# Patient Record
Sex: Female | Born: 1975 | Race: White | Hispanic: No | Marital: Married | Smoking: Never smoker
Health system: Southern US, Community
[De-identification: ages and names within clinical notes are randomized; demographics above are authoritative.]

## PROBLEM LIST (undated history)

## (undated) DIAGNOSIS — I10 Essential (primary) hypertension: Secondary | ICD-10-CM

## (undated) DIAGNOSIS — D649 Anemia, unspecified: Secondary | ICD-10-CM

## (undated) DIAGNOSIS — F4310 Post-traumatic stress disorder, unspecified: Secondary | ICD-10-CM

## (undated) DIAGNOSIS — F319 Bipolar disorder, unspecified: Secondary | ICD-10-CM

## (undated) HISTORY — DX: Bipolar disorder, unspecified: F31.9

## (undated) HISTORY — DX: Post-traumatic stress disorder, unspecified: F43.10

## (undated) HISTORY — DX: Essential (primary) hypertension: I10

## (undated) HISTORY — DX: Anemia, unspecified: D64.9

---

## 2006-11-02 ENCOUNTER — Emergency Department: Payer: Self-pay | Admitting: General Practice

## 2008-01-27 ENCOUNTER — Other Ambulatory Visit: Payer: Self-pay

## 2008-01-27 ENCOUNTER — Emergency Department: Payer: Self-pay | Admitting: Emergency Medicine

## 2009-12-07 ENCOUNTER — Ambulatory Visit: Payer: Self-pay | Admitting: Obstetrics and Gynecology

## 2016-02-13 ENCOUNTER — Ambulatory Visit: Payer: Self-pay

## 2016-12-15 ENCOUNTER — Telehealth: Payer: Self-pay | Admitting: Pharmacist

## 2016-12-15 NOTE — Telephone Encounter (Signed)
Called [facility] for refill on Lamictal.

## 2017-01-02 ENCOUNTER — Telehealth: Payer: Self-pay | Admitting: Pharmacist

## 2017-01-02 NOTE — Telephone Encounter (Signed)
Faxed Re Enrollment application to [facility] for Saphris.

## 2017-01-29 ENCOUNTER — Telehealth: Payer: Self-pay | Admitting: Pharmacist

## 2017-01-29 NOTE — Telephone Encounter (Signed)
01/29/2017 Lamictal 200mg  Take 1 tablet by mouth two times a day, Faxed [facility] Re Enrollment application for processing.

## 2017-02-18 ENCOUNTER — Telehealth: Payer: Self-pay | Admitting: Pharmacist

## 2017-02-18 NOTE — Telephone Encounter (Signed)
02/18/17 Patient is eligible at [facility] till 01/20/17.

## 2017-03-09 ENCOUNTER — Telehealth: Payer: Self-pay | Admitting: Pharmacist

## 2017-03-09 NOTE — Telephone Encounter (Signed)
03/09/17 Called [facility] for refill on Saphris 5mg , will ship 03/25/17.

## 2017-06-10 ENCOUNTER — Telehealth: Payer: Self-pay | Admitting: Pharmacist

## 2017-06-10 NOTE — Telephone Encounter (Signed)
06/10/17 Called [facility] for refill on Saphris 5mg .Forde RadonAJ

## 2017-09-14 ENCOUNTER — Telehealth: Payer: Self-pay | Admitting: Pharmacist

## 2017-09-14 NOTE — Telephone Encounter (Signed)
09/14/17 Called [facility] for refill on Saphris 5mg , order# [id number], allow 7-10 business days to receive.Forde RadonAJ

## 2017-12-07 ENCOUNTER — Telehealth: Payer: Self-pay | Admitting: Pharmacist

## 2017-12-07 NOTE — Telephone Encounter (Signed)
12/07/17 Patient in office today to print spouse check stubs and taxes. She also brought in [facility] application that had been mailed to her for Renewal-Lamictal. Patient has already signed her page, I have printed script and will take to Dr. Rogers BlockerAhluwalia to sign.Forde RadonAJ

## 2017-12-07 NOTE — Telephone Encounter (Signed)
12/07/17 I have received signed [facility] (Saphris) application back from patient, she has also returned 1 month of income. I am holding for provider to return their portion.Forde RadonAJ

## 2017-12-18 ENCOUNTER — Telehealth: Payer: Self-pay | Admitting: Pharmacist

## 2017-12-18 NOTE — Telephone Encounter (Signed)
12/18/17 Saphris 5mg  [facility]17 for Dr. Onnie GrahamAhulwalia to sign for this med, [facility]17 & 12/17/17 and the forms have not been signed, saw Toniann FailWendy and she will get him to sign, and call us.Forde RadonAJ

## 2017-12-18 NOTE — Telephone Encounter (Signed)
12/18/17 Lamictal 200mg  [facility]17 for Dr. Onnie GrahamAhulwalia to sign for this med, [facility]/17/17 and the forms have not been signed, saw Toniann FailWendy and she will get him to sign, and call us.Forde RadonAJ

## 2018-01-08 ENCOUNTER — Telehealth: Payer: Self-pay | Admitting: Pharmacist

## 2018-01-08 NOTE — Telephone Encounter (Signed)
01/08/2018 11:59:08 AM - Lamictal form from provider  01/08/18 [facility] pick up Lamictal forms, and this had been signed by provider, it was dropped off at their office 12/03/17.AJ   01/08/2018 12:01:46 PM - Saphris form at provider office  01/08/18 Went to provider office 01/07/18 to pick up form for Saphris that was dropped off 12/03/17, this form was not signed, they will get him to sign before I pick up again next week.Forde RadonAJ

## 2018-01-11 ENCOUNTER — Telehealth: Payer: Self-pay | Admitting: Pharmacist

## 2018-01-11 NOTE — Telephone Encounter (Signed)
01/11/2018 9:24:46 AM - Lamictal 200mg  renewal to [facility]  01/11/18 Faxed [facility] application for Renewal on Lamictal 200 mg Take 1 tablet by mouth two times a day.Forde RadonAJ

## 2018-01-15 ENCOUNTER — Telehealth: Payer: Self-pay | Admitting: Pharmacist

## 2018-01-15 NOTE — Telephone Encounter (Signed)
01/15/2018 10:46:47 AM - Saphris 5mg  Black Cherry flavor  01/15/18 Faxed PAP renewal application to [facility] for Saphris 5mg  [employer].Forde RadonAJ

## 2018-02-01 ENCOUNTER — Telehealth: Payer: Self-pay | Admitting: Pharmacy Technician

## 2018-02-01 NOTE — Telephone Encounter (Signed)
Received updated proof of income.  Patient eligible to receive medication assistance at [facility] through 2019, as long as eligibility requirements continue to be met.  [facility] [facility]

## 2019-01-24 ENCOUNTER — Telehealth: Payer: Self-pay | Admitting: Pharmacist

## 2019-01-24 NOTE — Telephone Encounter (Signed)
01/24/2019 2:46:57 PM - Lamictal refill  01/24/2019 Placed refill online with [facility] for Lamictal 200mg , order# [id number].Forde Radon

## 2019-08-01 ENCOUNTER — Telehealth: Payer: Self-pay | Admitting: Pharmacy Technician

## 2019-08-01 NOTE — Telephone Encounter (Signed)
Received 2020 proof of income.  Patient eligible to receive medication assistance at [facility] as long as eligibility requirements continue to be met.  [place] [facility]

## 2019-08-24 ENCOUNTER — Telehealth: Payer: Self-pay | Admitting: Pharmacist

## 2019-08-24 NOTE — Telephone Encounter (Signed)
08/24/2019 1:51:53 PM - Lamictal refill online with [facility]  08/24/2019 Placed refill online with [facility] for Lamictal, to ship 09/06/2019, Order# [id number].Delos Haring

## 2020-01-31 ENCOUNTER — Telehealth: Payer: Self-pay | Admitting: Pharmacy Technician

## 2020-01-31 NOTE — Telephone Encounter (Signed)
Received updated proof of income.  Patient eligible to receive medication assistance at [facility] until time for re-certification in 9359, and as long as eligibility requirements continue to be met.  [place] [facility]

## 2020-03-05 ENCOUNTER — Other Ambulatory Visit: Payer: Self-pay

## 2020-03-20 ENCOUNTER — Ambulatory Visit: Payer: Self-pay | Admitting: Pharmacist

## 2020-03-20 DIAGNOSIS — D619 Aplastic anemia, unspecified: Secondary | ICD-10-CM | POA: Insufficient documentation

## 2020-03-20 DIAGNOSIS — F4310 Post-traumatic stress disorder, unspecified: Secondary | ICD-10-CM | POA: Insufficient documentation

## 2020-03-20 DIAGNOSIS — F319 Bipolar disorder, unspecified: Secondary | ICD-10-CM | POA: Insufficient documentation

## 2020-03-20 DIAGNOSIS — Z79899 Other long term (current) drug therapy: Secondary | ICD-10-CM

## 2020-03-20 DIAGNOSIS — I10 Essential (primary) hypertension: Secondary | ICD-10-CM | POA: Insufficient documentation

## 2020-03-20 NOTE — Progress Notes (Signed)
  [facility] Visit Note  Patient: Amber Curtis MRN: [id number] Date of Birth: [birth year] PCP: No primary care provider on file.   Amber Curtis 44 y.o. female presents for a MTM visit today via telephone. Patient was identified by name and DOB.  There were no vitals taken for this visit.  Patient Information   Past Medical History:  Diagnosis Date  . Anemia   . Bipolar 1 disorder (HCC)   . Hypertension   . PTSD (post-traumatic stress disorder)      History reviewed. No pertinent surgical history.   Family History  Problem Relation Age of Onset  . Congestive Heart Failure Mother   . Hypertension Mother   . Diabetes Father   . Hypertension Sister     New Diagnoses (since last visit):   Lifestyle  does zumba everyday except the weekends Diet: Breakfast:oatmeal fruit Lunch: light lunch, grilled chicken sandwich, [place] sandwich or salad Dinner: heaviest meal of the day, fast food, cooks at home Drinks: 4 water bottles a day an occasional soda  Health Maintenance/Date Completed  Last Visit to PCP: 1 year ago Next Visit to PCP: May 30 2020 Specialist Visit: May 30 2020 Flu Vaccine: No Pneumonia Vaccine: no COVID-19 Vaccine: no Shingrix Vaccine: no  Outpatient Encounter Medications as of 03/20/2020  Medication Sig  . ARIPiprazole (ABILIFY) 15 MG tablet Take 15 mg by mouth at bedtime.  . busPIRone (BUSPAR) 10 MG tablet Take 10 mg by mouth 3 (three) times daily.  . hydrochlorothiazide (HYDRODIURIL) 25 MG tablet Take 25 mg by mouth daily.  Marland Kitchen lamoTRIgine (LAMICTAL) 200 MG tablet Take 200 mg by mouth 2 (two) times daily.  Marland Kitchen lisinopril (ZESTRIL) 20 MG tablet Take 20 mg by mouth daily.  . Multiple Vitamin (MULTIVITAMIN) capsule Take 1 capsule by mouth daily.   No facility-administered encounter medications on file as of 03/20/2020.             Social History   Substance and Sexual Activity  Alcohol Use Not Currently      Social History    Tobacco Use  Smoking Status Never Smoker  Smokeless Tobacco Never Used      Health Maintenance  Topic Date Due  . COVID-19 Vaccine (1) Never done  . HIV Screening  Never done  . TETANUS/TDAP  Never done  . PAP SMEAR-Modifier  Never done  . INFLUENZA VACCINE  06/03/2020     Assessment and Plan:  1. Bipolar/PTSD: Pt states she likes her current regimen and it's been working for her really well. She says working from home has also been good for her. No complaints of side effects.   2. HTN: Pt is on Lisinopril 20 mg and HCTZ 25 mg no complaints, stated that her last in office measurement was 137/80 and doesn't regularly check her BP. Recommended keeping a log and getting BP monitored outside of office to avoid white coat HTN.   3. Adherence/Compliance: Pt stated she has no problems remembering to take her meds and never really misses a dose. Pt also stated that she was previously on metformin however when inquired about why she d/c she said she would prefer not to divulge into that information. Also when asked whether or not she was diabetic or had a1c readings she deferred.   RTC 9 months  Kennedy Bucker (Ty) Fredericka Bottcher PharmD Candidate [facility]  Cosigned: Keri K. Joelene Millin, PharmD [facility] Clinic-Pharmacy Operations Coordinator [phone]

## 2020-03-21 ENCOUNTER — Other Ambulatory Visit: Payer: Self-pay

## 2020-03-28 ENCOUNTER — Other Ambulatory Visit: Payer: Self-pay

## 2020-06-06 ENCOUNTER — Other Ambulatory Visit: Payer: Self-pay

## 2020-07-19 ENCOUNTER — Telehealth: Payer: Self-pay | Admitting: Pharmacist

## 2020-07-19 NOTE — Telephone Encounter (Signed)
07/19/2020 4:13:39 PM - Lamictal refill online with [facility]  -- Rhetta Mura - Thursday, July 19, 2020 4:12 PM --Placed refill online with [facility] for Lamictal 200mg  , to ship 08/24/2020, Order# 08/26/2020.

## 2020-08-14 ENCOUNTER — Other Ambulatory Visit: Payer: Self-pay

## 2020-11-22 ENCOUNTER — Telehealth: Payer: Self-pay | Admitting: Pharmacy Technician

## 2020-11-22 NOTE — Telephone Encounter (Signed)
Patient called to inform [facility] that she now has prescription drug coverage with [employer].  Patient verbally acknowledged that she understood that she does not meet [facility]'s eligibility criteria.    Sherilyn Dacosta Care Manager [facility]   [address], [place]  [address]  November 22, 2020    Denessa Cavan [address] [place], [place]  [address]  Dear Marijean Niemann:  This is to inform you that you are no longer eligible to receive medication assistance at [facility].  The reason(s) are:    _____Your total gross monthly household income exceeds 250% of the Federal Poverty Level.   _____Tangible assets (savings, checking, stocks/bonds, pension, retirement, etc.) exceeds our limit  _____You are eligible to receive benefits from [facility], [facility] or HIV Medication            Assistance program _____You are eligible to receive benefits from a Medicare Part "D" plan __X__You have prescription insurance  _____You are not an [facility] resident _____Failure to provide all requested proof of income information for 2021.    We regret that we are unable to help you at this time.  If your prescription coverage is terminated, please contact [facility], so that we may reassess your eligibility for our program.  If you have questions, we may be contacted at [phone].  Thank you,  [facility]

## 2021-12-26 ENCOUNTER — Encounter: Payer: Self-pay | Admitting: Emergency Medicine

## 2021-12-26 ENCOUNTER — Other Ambulatory Visit: Payer: Self-pay

## 2021-12-26 ENCOUNTER — Ambulatory Visit
Admission: EM | Admit: 2021-12-26 | Discharge: 2021-12-26 | Disposition: A | Discharge status: Home / Self Care | Payer: BC Managed Care – PPO | Attending: Emergency Medicine | Admitting: Emergency Medicine

## 2021-12-26 ENCOUNTER — Ambulatory Visit (INDEPENDENT_AMBULATORY_CARE_PROVIDER_SITE_OTHER): Payer: BC Managed Care – PPO

## 2021-12-26 DIAGNOSIS — W19XXXA Unspecified fall, initial encounter: Secondary | ICD-10-CM

## 2021-12-26 DIAGNOSIS — M25532 Pain in left wrist: Secondary | ICD-10-CM

## 2021-12-26 DIAGNOSIS — S52122A Displaced fracture of head of left radius, initial encounter for closed fracture: Secondary | ICD-10-CM | POA: Diagnosis not present

## 2021-12-26 DIAGNOSIS — M79632 Pain in left forearm: Secondary | ICD-10-CM | POA: Diagnosis not present

## 2021-12-26 MED ORDER — OXYCODONE HCL 5 MG PO TABS: 5.0000 mg | ORAL_TABLET | ORAL | 0 refills | Status: AC | PRN

## 2021-12-26 NOTE — Discharge Instructions (Addendum)
Your x-ray today showed a fracture ( break in bone) of note in the head of her left radius bone  X-ray of your left wrist is negative for injury  A splint has been applied to your to your left arm. This is used to protect your injury and prevent further damage. Leave in place until seen by orthopedic specialist. Do not put objects into splint to scratch skin. If numbness or tingling occurs after placement please return to Urgent Care for evaluation.   May use oxycodone every 4 hours as needed for severe pain  Follow up with orthopedic specialist in 1-2 weeks. Call practice to make appointment. Information listed below. You may go to any orthopedic provider you deem fit.  Please do not put weight on fracture.

## 2021-12-26 NOTE — ED Provider Notes (Signed)
[facility]    CSN: [id number] Arrival date & time: 12/26/21  0803      History   Chief Complaint Chief Complaint  Patient presents with   Fall   Arm Injury    left    HPI Amber Curtis is a 46 y.o. female.   Patient presents with left wrist pain and left forearm pain for 1 week after falling onto concrete.  Endorses she was walking when she tripped and fell landing onto the palm of her right and left hand and her left forearm.  Symptoms have improved but are still present.  Range of motion intact but elicits pain.  Denies numbness, tingling, prior injury or trauma.  Past Medical History:  Diagnosis Date   Anemia    Bipolar 1 disorder (HCC)    Hypertension    PTSD (post-traumatic stress disorder)     Patient Active Problem List   Diagnosis Date Noted   HTN (hypertension) 03/20/2020   Bipolar 1 disorder (HCC) 03/20/2020   PTSD (post-traumatic stress disorder) 03/20/2020   Aplastic anemia (HCC) 03/20/2020    History reviewed. No pertinent surgical history.  OB History   No obstetric history on file.      Home Medications    Prior to Admission medications   Medication Sig Start Date End Date Taking? Authorizing Provider  ARIPiprazole (ABILIFY) 15 MG tablet Take 15 mg by mouth at bedtime.   Yes [provider]  busPIRone (BUSPAR) 10 MG tablet Take 10 mg by mouth 3 (three) times daily.   Yes [provider]  lamoTRIgine (LAMICTAL) 200 MG tablet Take 200 mg by mouth 2 (two) times daily.   Yes [provider]  ARIPiprazole (ABILIFY) 15 MG tablet TAKE ONE TABLET BY MOUTH EVERY DAY 06/06/20 03/02/21    hydrochlorothiazide (HYDRODIURIL) 25 MG tablet Take 25 mg by mouth daily.    [provider]  lisinopril (ZESTRIL) 20 MG tablet Take 20 mg by mouth daily.    [provider]  Multiple Vitamin (MULTIVITAMIN) capsule Take 1 capsule by mouth daily.    [provider]    Family History Family History   Problem Relation Age of Onset   Congestive Heart Failure Mother    Hypertension Mother    Diabetes Father    Hypertension Sister     Social History Social History   Tobacco Use   Smoking status: Never   Smokeless tobacco: Never  Vaping Use   Vaping Use: Never used  Substance Use Topics   Alcohol use: Not Currently   Drug use: Not Currently     Allergies   Sulfa antibiotics, Celexa [citalopram], Codeine, Depakote [divalproex sodium], Haldol [haloperidol], Penicillins, and Risperidone   Review of Systems Review of Systems Defer to HPI   Physical Exam Triage Vital Signs ED Triage Vitals  Enc Vitals Group     BP 12/26/21 0819 (!) 180/102     Pulse Rate 12/26/21 0819 75     Resp 12/26/21 0819 18     Temp 12/26/21 0819 98.5 F (36.9 C)     Temp Source 12/26/21 0819 Oral     SpO2 12/26/21 0819 98 %     Weight 12/26/21 0816 298 lb (135.2 kg)     Height 12/26/21 0816 5\' 7"  (1.702 m)     Head Circumference --      Peak Flow --      Pain Score 12/26/21 0816 8     Pain Loc --  Pain Edu? --      Excl. in [place]? --    No data found.  Updated Vital Signs BP (!) 180/102 (BP Location: Left Arm)    Pulse 75    Temp 98.5 F (36.9 C) (Oral)    Resp 18    Ht 5\' 7"  (1.702 m)    Wt 298 lb (135.2 kg)    LMP 12/23/2021 (Approximate)    SpO2 98%    BMI 46.67 kg/m   Visual Acuity Right Eye Distance:   Left Eye Distance:   Bilateral Distance:    Right Eye Near:   Left Eye Near:    Bilateral Near:     Physical Exam Constitutional:      Appearance: Normal appearance.  HENT:     Head: Normocephalic.  Eyes:     Extraocular Movements: Extraocular movements intact.  Pulmonary:     Effort: Pulmonary effort is normal.  Musculoskeletal:     Comments: Tenderness with mild swelling around the lateral aspect left forearm, no ecchymosis noted, limited range of motion, sensation intact  No tenderness or swelling noted throughout the left wrist, range of motion intact, 2+ radial  pulse,   Skin:    General: Skin is warm and dry.  Neurological:     Mental Status: She is alert and oriented to person, place, and time. Mental status is at baseline.  Psychiatric:        Mood and Affect: Mood normal.        Behavior: Behavior normal.     UC Treatments / Results  Labs (all labs ordered are listed, but only abnormal results are displayed) Labs Reviewed - No data to display  EKG   Radiology No results found.  Procedures Procedures (including critical care time)  Medications Ordered in UC Medications - No data to display  Initial Impression / Assessment and Plan / UC Course  I have reviewed the triage vital signs and the nursing notes.  Pertinent labs & imaging results that were available during my care of the patient were reviewed by me and considered in my medical decision making (see chart for details).  Closed displaced fracture of the head of the left radius, initial encounter  Fracture confirmed by x-ray, wrist x-ray negative, discussed findings with patient, sugar-tong placed to the left arm, nonweightbearing precautions, given referral to orthopedic to follow-up in 1 to 2 weeks for further management and evaluation, oxycodone IR 5 mg every 4 hours prescribed for pain, PDMP reviewed, low risk, work note given  Final Clinical Impressions(s) / UC Diagnoses   Final diagnoses:  None   Discharge Instructions   None    ED Prescriptions   None    PDMP not reviewed this encounter.   Hans Eden, [place] 12/26/21 [phone]

## 2021-12-26 NOTE — ED Triage Notes (Signed)
Pt c/o left forearm, elbow and wrist pain. She states she fell on the concrete about a week ago. She states she has been taking ibuprofen without relief. She has decreased ROM in her elbow and wrist.

## 2023-03-06 IMAGING — CR DG WRIST COMPLETE 3+V*L*
4 series · 4 of 4 positions shown · non-contrast
Comparison: None

CLINICAL DATA: LEFT forearm, elbow and wrist pain post fall on
concrete 1 week ago

EXAM:
LEFT WRIST - COMPLETE 3+ VIEW

[wrist pa]
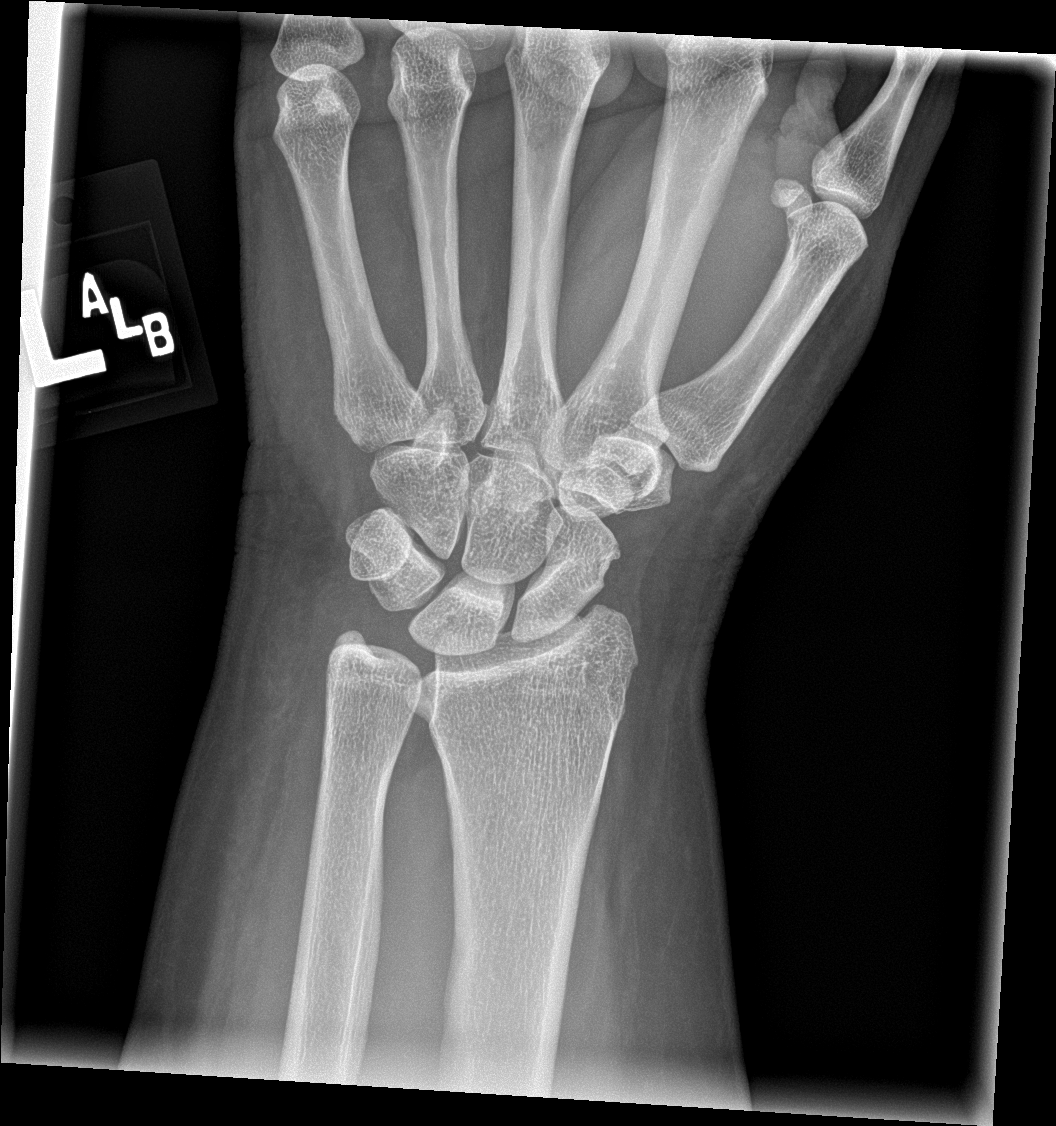

[wrist obl]
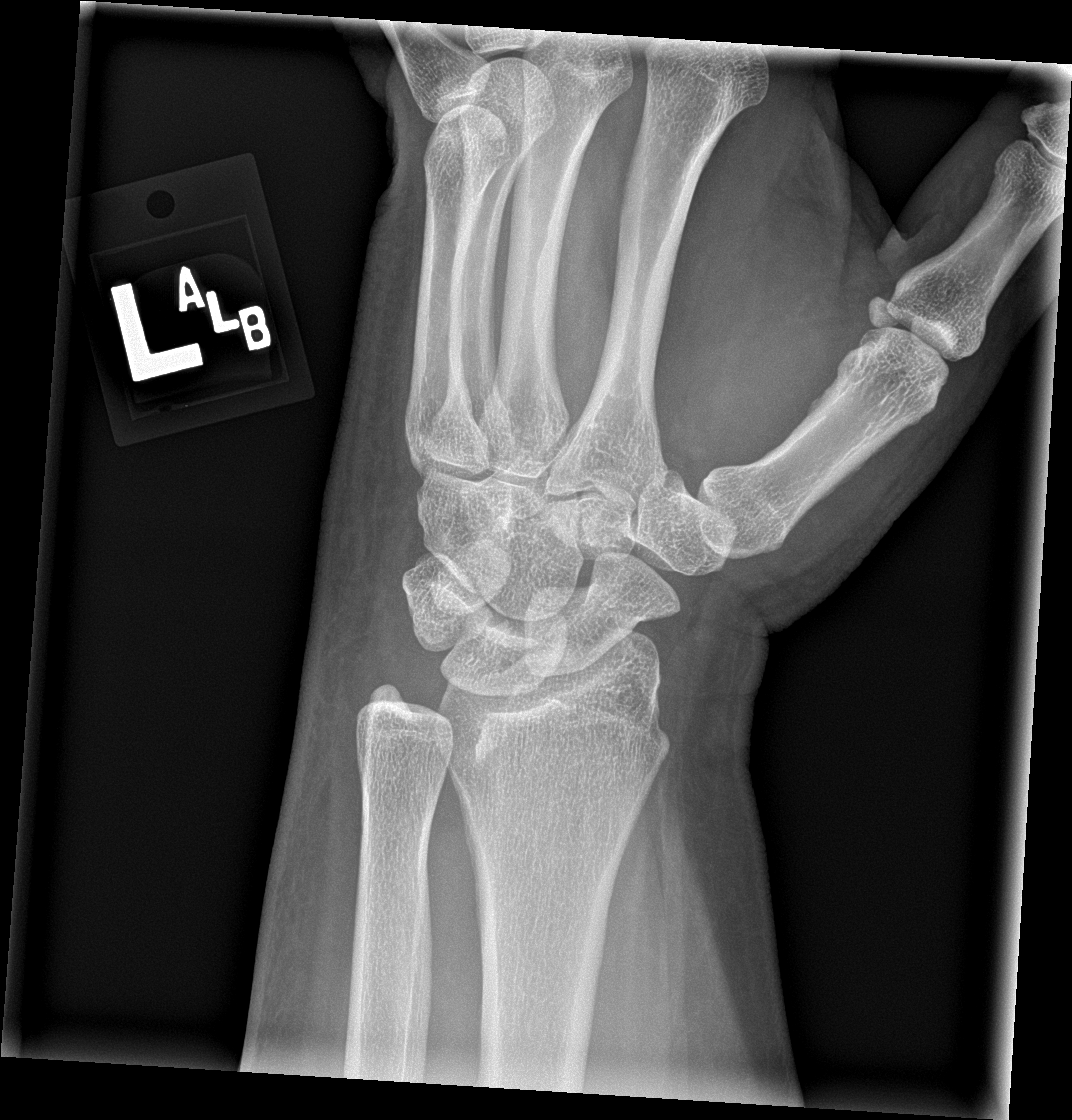

[wrist lat]
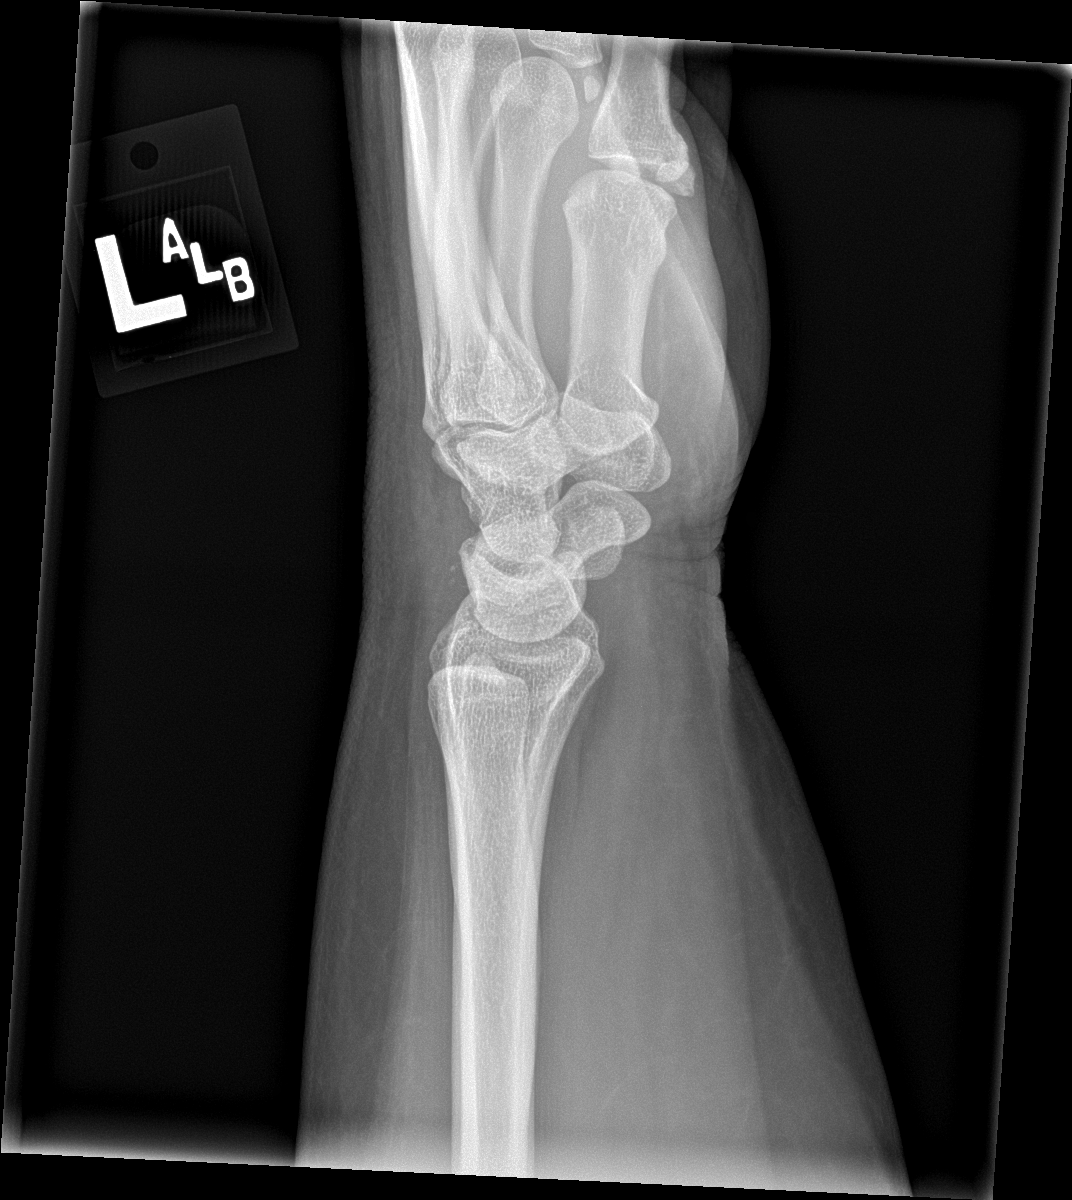

[wrist navicular]
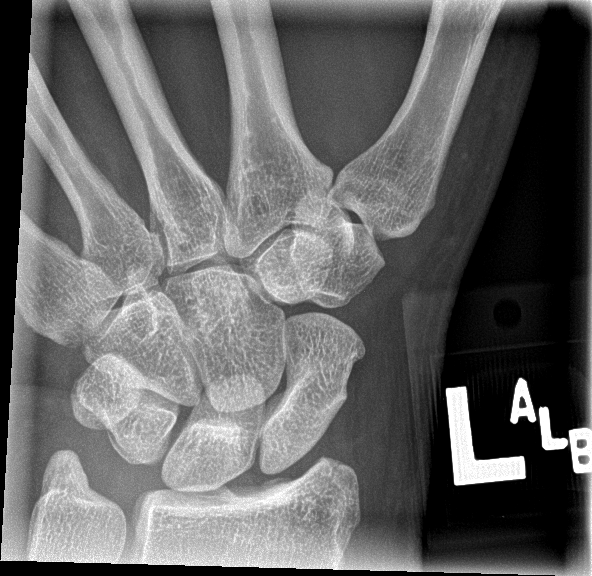

[4 of 4 positions shown; findings below may reference images not displayed]

FINDINGS: Osseous mineralization normal.

Joint spaces preserved.

No acute fracture, dislocation, or bone destruction.
IMPRESSION: Normal exam.
